# Patient Record
Sex: Female | Born: 1965 | Race: White | Hispanic: No | Marital: Single | Smoking: Current some day smoker
Health system: Southern US, Community
[De-identification: ages and names within clinical notes are randomized; demographics above are authoritative.]

## PROBLEM LIST (undated history)

## (undated) DIAGNOSIS — I10 Essential (primary) hypertension: Secondary | ICD-10-CM

---

## 2005-02-09 ENCOUNTER — Ambulatory Visit (HOSPITAL_COMMUNITY): Admission: RE | Admit: 2005-02-09 | Discharge: 2005-02-09 | Payer: Self-pay | Admitting: Neurology

## 2017-05-24 ENCOUNTER — Encounter: Payer: Self-pay | Admitting: Emergency Medicine

## 2017-05-24 ENCOUNTER — Other Ambulatory Visit: Payer: Self-pay

## 2017-05-24 ENCOUNTER — Emergency Department (HOSPITAL_BASED_OUTPATIENT_CLINIC_OR_DEPARTMENT_OTHER)
Admission: EM | Admit: 2017-05-24 | Discharge: 2017-05-24 | Disposition: A | Discharge status: Home / Self Care | Payer: Self-pay | Attending: Emergency Medicine | Admitting: Emergency Medicine

## 2017-05-24 DIAGNOSIS — K0889 Other specified disorders of teeth and supporting structures: Secondary | ICD-10-CM | POA: Insufficient documentation

## 2017-05-24 DIAGNOSIS — I10 Essential (primary) hypertension: Secondary | ICD-10-CM | POA: Insufficient documentation

## 2017-05-24 DIAGNOSIS — F17200 Nicotine dependence, unspecified, uncomplicated: Secondary | ICD-10-CM | POA: Insufficient documentation

## 2017-05-24 DIAGNOSIS — F1210 Cannabis abuse, uncomplicated: Secondary | ICD-10-CM | POA: Insufficient documentation

## 2017-05-24 HISTORY — DX: Essential (primary) hypertension: I10

## 2017-05-24 MED ORDER — BENZOCAINE 10 % MT GEL: 1.0000 "application " | OROMUCOSAL | 0 refills | Status: AC | PRN

## 2017-05-24 MED ORDER — PENICILLIN V POTASSIUM 500 MG PO TABS: 500.0000 mg | ORAL_TABLET | Freq: Four times a day (QID) | ORAL | 0 refills | Status: AC

## 2017-05-24 MED ORDER — IBUPROFEN 400 MG PO TABS
600.0000 mg | ORAL_TABLET | Freq: Once | ORAL | Status: AC
  Administered 2017-05-24: 600 mg via ORAL
  Filled 2017-05-24: qty 1

## 2017-05-24 NOTE — ED Triage Notes (Signed)
Presents with upper right molar pain and swelling to jaw that began yesterday. Pain is worse with opening mouth and talking.

## 2017-05-24 NOTE — ED Provider Notes (Signed)
[facility] EMERGENCY DEPARTMENT Provider Note   CSN: [id number] Arrival date & time: 05/24/17  1108     History   Chief Complaint Chief Complaint  Patient presents with  . Dental Pain    HPI  Bailey Mccullough is a 52 y.o. Female with a history of hypertension, presents to the ED for evaluation of dental pain.  Patient reports pain over the right upper molars, and swelling around the upper jaw that began yesterday.  Patient reports pain is made worse with opening the mouth, talking and eating.  Patient denies any drainage in the teeth or gums.  No swelling or pain under the tongue, no difficulty breathing, no neck pain or tenderness.  Denies any fevers or chills, no nausea or vomiting.  Patient tried some ibuprofen for pain which did improve the pain some.  Reports 1 previous instance of similar symptoms, reports her dentist put her on antibiotics, and then performed extraction.  Patient's dentist is Dr. Ronalee RedHartsell.      Past Medical History:  Diagnosis Date  . Hypertension     There are no active problems to display for this patient.   History reviewed. No pertinent surgical history.  OB History    No data available       Home Medications    Prior to Admission medications   Not on File    Family History History reviewed. No pertinent family history.  Social History Social History   Tobacco Use  . Smoking status: Current Some Day Smoker  Substance Use Topics  . Alcohol use: No    Frequency: Never  . Drug use: Yes    Types: Marijuana     Allergies   Patient has no known allergies.   Review of Systems Review of Systems  Constitutional: Negative for chills and fever.  HENT: Positive for dental problem, ear pain and facial swelling. Negative for trouble swallowing and voice change.   Eyes: Negative for pain, discharge, redness, itching and visual disturbance.  Respiratory: Negative for shortness of breath and stridor.   Gastrointestinal:  Negative for nausea and vomiting.     Physical Exam Updated Vital Signs BP (!) 185/98 (BP Location: Right Arm)   Pulse 100   Temp 98.2 F (36.8 C) (Oral)   Resp 16   LMP  (Approximate)   SpO2 95%   Physical Exam  Constitutional: She appears well-developed and well-nourished. No distress.  HENT:  Head: Normocephalic and atraumatic.  Teeth in general are in poor dentition, tenderness to palpation over first and second right upper molars, with erythema and swelling of the surrounding gums, there is an area of induration over the gumline, but no obvious fluctuance or fluid drainage, no sublingual swelling or tenderness, posterior oropharynx is clear with anterior and posterior arches visible, there is some facial swelling over the right cheek, swelling does not extend all the way up to the orbit, no fluctuance on palpation  Eyes: EOM are normal. Pupils are equal, round, and reactive to light. Right eye exhibits no discharge. Left eye exhibits no discharge.  No periorbital swelling, no pain with EOMs  Neck: Normal range of motion. Neck supple.  No tenderness over the neck, normal range of motion  Pulmonary/Chest: Effort normal. No respiratory distress.  Neurological: She is alert. Coordination normal.  Skin: She is not diaphoretic.  Psychiatric: She has a normal mood and affect. Her behavior is normal.  Nursing note and vitals reviewed.    ED Treatments / Results  Labs (all labs ordered are listed, but only abnormal results are displayed) Labs Reviewed - No data to display  EKG  EKG Interpretation None       Radiology No results found.  Procedures Procedures (including critical care time)  Medications Ordered in ED Medications  ibuprofen (ADVIL,MOTRIN) tablet 600 mg (600 mg Oral Given 05/24/17 1151)     Initial Impression / Assessment and Plan / ED Course  I have reviewed the triage vital signs and the nursing notes.  Pertinent labs & imaging results that were  available during my care of the patient were reviewed by me and considered in my medical decision making (see chart for details).  Patient with toothache over right upper molars, with some facial swelling.  Initially tachycardic and hypertensive, I feel this is likely due to pain as patient is very well-appearing, will reassess vitals after pain treated here in the ED.  Exam unconcerning for Ludwig's angina or spread of infection.  No periorbital swelling, EOMs intact, no vision changes.    Pain treated here in the ED with improvement, tachycardia resolved and hypertension improved, no headache, vision changes, CP or SOB to suggest hypertensive urgency or emergency.  Patient also reports she has been told by her primary care doctor that she has white coat syndrome and her blood pressure is always elevated when she is in the doctor's office.  Will have patient follow-up with her primary care doctor for blood pressure recheck as she has had hypertension requiring treatment in the past, but this improved with diet and exercise.  Will treat with penicillin and anti-inflammatories medicine.  Urged patient to follow-up with dentist, she plans to call Dr. Leda Quail office in the morning.   Final Clinical Impressions(s) / ED Diagnoses   Final diagnoses:  Pain, dental  Toothache    ED Discharge Orders    None       Legrand Rams 05/24/17 1225    Benjiman Core, MD 05/24/17 1544

## 2017-05-24 NOTE — Discharge Instructions (Addendum)
Please take entire course of antibiotics as directed even if symptoms improve.  Please call tomorrow morning to schedule follow-up appointment with your dentist.  You continue using ibuprofen and Tylenol for pain as well as topical Orajel.  If you have worsening swelling, or swelling around your eye, pain with looking side to side, difficulty breathing or shortness of breath, swelling or pain under the tongue or other new or concerning symptoms please return to the ED for reevaluation.  These follow-up with your primary care doctor in [place] for a blood pressure recheck in the next 1-2 weeks.

## 2018-11-03 ENCOUNTER — Emergency Department (HOSPITAL_BASED_OUTPATIENT_CLINIC_OR_DEPARTMENT_OTHER): Payer: Self-pay

## 2018-11-03 ENCOUNTER — Encounter (HOSPITAL_BASED_OUTPATIENT_CLINIC_OR_DEPARTMENT_OTHER): Payer: Self-pay | Admitting: Adult Health

## 2018-11-03 ENCOUNTER — Other Ambulatory Visit: Payer: Self-pay

## 2018-11-03 ENCOUNTER — Emergency Department (HOSPITAL_BASED_OUTPATIENT_CLINIC_OR_DEPARTMENT_OTHER)
Admission: EM | Admit: 2018-11-03 | Discharge: 2018-11-03 | Disposition: A | Discharge status: Home / Self Care | Payer: Self-pay | Attending: Emergency Medicine | Admitting: Emergency Medicine

## 2018-11-03 DIAGNOSIS — I10 Essential (primary) hypertension: Secondary | ICD-10-CM | POA: Insufficient documentation

## 2018-11-03 DIAGNOSIS — Z720 Tobacco use: Secondary | ICD-10-CM | POA: Insufficient documentation

## 2018-11-03 DIAGNOSIS — R2242 Localized swelling, mass and lump, left lower limb: Secondary | ICD-10-CM | POA: Insufficient documentation

## 2018-11-03 DIAGNOSIS — M79672 Pain in left foot: Secondary | ICD-10-CM | POA: Insufficient documentation

## 2018-11-03 DIAGNOSIS — Z79899 Other long term (current) drug therapy: Secondary | ICD-10-CM | POA: Insufficient documentation

## 2018-11-03 MED ORDER — CLINDAMYCIN HCL 300 MG PO CAPS: 300.0000 mg | ORAL_CAPSULE | Freq: Three times a day (TID) | ORAL | 0 refills | Status: AC

## 2018-11-03 NOTE — ED Triage Notes (Signed)
Bailey Mccullough presents with 24 hours of left foot pain at the midfoot, she denies injury, states the pain is severe. Area slightly ertythematous. Good pedal pulses. Took one percocet with some relief.

## 2018-11-03 NOTE — Discharge Instructions (Addendum)
Start clindamycin in case this is infectious.  Recommend ice, Tylenol, Motrin at home for pain and swelling as well in case this is inflammatory.  Please return to the ED if symptoms worsen especially after 48 to 72 hours.  Possibly you have a sprain from an unknown injury as well.

## 2018-11-03 NOTE — ED Provider Notes (Signed)
[facility] EMERGENCY DEPARTMENT Provider Note   CSN: [id number] Arrival date & time: 11/03/18  1727    History   Chief Complaint Chief Complaint  Patient presents with  . Foot Pain    HPI Bailey Mccullough is a 53 y.o. female.     Possible bug bite to left foot.  The history is provided by the patient.  Foot Pain This is a new problem. The current episode started yesterday. The problem occurs constantly. The problem has not changed since onset.Pertinent negatives include no chest pain, no abdominal pain and no shortness of breath. The symptoms are aggravated by walking. Nothing relieves the symptoms. She has tried nothing for the symptoms. The treatment provided no relief.    Past Medical History:  Diagnosis Date  . Hypertension     There are no active problems to display for this patient.   History reviewed. No pertinent surgical history.   OB History   No obstetric history on file.      Home Medications    Prior to Admission medications   Medication Sig Start Date End Date Taking? Authorizing Provider  benzocaine (ORAJEL) 10 % mucosal gel Use as directed 1 application in the mouth or throat as needed for mouth pain. 05/24/17   Dartha LodgeFord, Kelsey N, PA-C  clindamycin (CLEOCIN) 300 MG capsule Take 1 capsule (300 mg total) by mouth 3 (three) times daily for 10 days. 11/03/18 11/13/18  Virgina Norfolkuratolo, Doyle Kunath, DO    Family History History reviewed. No pertinent family history.  Social History Social History   Tobacco Use  . Smoking status: Current Some Day Smoker  Substance Use Topics  . Alcohol use: No    Frequency: Never  . Drug use: Yes    Types: Marijuana     Allergies   Patient has no known allergies.   Review of Systems Review of Systems  Constitutional: Negative for chills and fever.  HENT: Negative for ear pain and sore throat.   Eyes: Negative for pain and visual disturbance.  Respiratory: Negative for cough and shortness of breath.    Cardiovascular: Negative for chest pain and palpitations.  Gastrointestinal: Negative for abdominal pain and vomiting.  Genitourinary: Negative for dysuria and hematuria.  Musculoskeletal: Positive for gait problem (left foot pain). Negative for arthralgias and back pain.  Skin: Negative for color change and rash.  Neurological: Negative for seizures, syncope, weakness and numbness.  All other systems reviewed and are negative.    Physical Exam Updated Vital Signs  ED Triage Vitals  Enc Vitals Group     BP 11/03/18 1736 (!) 159/95     Pulse Rate 11/03/18 1736 94     Resp 11/03/18 1736 18     Temp 11/03/18 1736 99.5 F (37.5 C)     Temp Source 11/03/18 1736 Oral     SpO2 11/03/18 1736 99 %     Weight --      Height --      Head Circumference --      Peak Flow --      Pain Score 11/03/18 1737 9     Pain Loc --      Pain Edu? --      Excl. in GC? --     Physical Exam Vitals signs and nursing note reviewed.  Constitutional:      General: She is not in acute distress.    Appearance: She is well-developed.  HENT:     Head: Normocephalic and atraumatic.  Eyes:     Conjunctiva/sclera: Conjunctivae normal.  Neck:     Musculoskeletal: Neck supple.  Cardiovascular:     Rate and Rhythm: Normal rate and regular rhythm.     Pulses: Normal pulses.     Heart sounds: No murmur.  Pulmonary:     Effort: Pulmonary effort is normal. No respiratory distress.     Breath sounds: Normal breath sounds.  Abdominal:     Palpations: Abdomen is soft.     Tenderness: There is no abdominal tenderness.  Musculoskeletal: Normal range of motion.        General: Swelling (trace of left foot) and tenderness (TTP to midfoot of top of left foot) present.  Skin:    General: Skin is warm and dry.     Findings: Erythema (on top of midfoot of left foot) present.  Neurological:     Mental Status: She is alert.     Sensory: No sensory deficit.     Motor: No weakness.      ED Treatments /  Results  Labs (all labs ordered are listed, but only abnormal results are displayed) Labs Reviewed - No data to display  EKG None  Radiology Dg Ankle Complete Left  Result Date: 11/03/2018 CLINICAL DATA:  Pain EXAM: LEFT ANKLE COMPLETE - 3+ VIEW COMPARISON:  None. FINDINGS: There is no acute displaced fracture or dislocation. There is a small plantar calcaneal spur. There are calcifications in the plantar fascia. There is minimal soft tissue swelling surrounding the lateral malleolus. IMPRESSION: No acute displaced fracture or dislocation. Electronically Signed   By: Katherine Mantlehristopher  Green M.D.   On: 11/03/2018 18:43   Dg Foot Complete Left  Result Date: 11/03/2018 CLINICAL DATA:  24 hours of LT foot pain at the midfoot, she denies injury, states the pain is severe. Area slightly erythematous. EXAM: LEFT FOOT - COMPLETE 3+ VIEW COMPARISON:  None. FINDINGS: No fracture or bone lesion. Joints are normally spaced and aligned.  No arthropathic changes. Small to moderate-sized plantar and dorsal calcaneal spurs. There are some calcifications along the plantar fascia near the calcaneus. Soft tissues are otherwise unremarkable. IMPRESSION: 1. No fracture, bone lesion or joint abnormality. 2. Calcaneal spurs. Electronically Signed   By: Amie Portlandavid  Ormond M.D.   On: 11/03/2018 18:42    Procedures Procedures (including critical care time)  Medications Ordered in ED Medications - No data to display   Initial Impression / Assessment and Plan / ED Course  I have reviewed the triage vital signs and the nursing notes.  Pertinent labs & imaging results that were available during my care of the patient were reviewed by me and considered in my medical decision making (see chart for details).     Bailey Mccullough is a 53 year old female with no significant medical history who presents to the ED with left foot swelling, pain.  Unknown if there was any injury.  She states that she felt like maybe she was bit by  something.  Has some pain and swelling over the top of the foot.  Mild area of redness.  X-ray showed no fractures.  Did show some soft tissue swelling.  Possible sprain versus infectious process versus inflammatory process.  No concern for septic joint.  Patient has normal range of motion, no fever.  Will recommend Tylenol, Motrin.  Will treat with clindamycin in case there is an element of cellulitis.  Patient does not have history of diabetes.  Recommend recheck in 48 to 72 hours if symptoms do  not improve with these treatments.  Patient discharged from ED in good condition.  This chart was dictated using voice recognition software.  Despite best efforts to proofread,  errors can occur which can change the documentation meaning.    Final Clinical Impressions(s) / ED Diagnoses   Final diagnoses:  Foot pain, left    ED Discharge Orders         Ordered    clindamycin (CLEOCIN) 300 MG capsule  3 times daily     11/03/18 1852           Lennice Sites, DO 11/03/18 2841

## 2020-05-20 IMAGING — DX LEFT FOOT - COMPLETE 3+ VIEW
3 series · 3 of 3 positions shown · non-contrast
Comparison: None.

CLINICAL DATA: 24 hours of LT foot pain at the midfoot, she denies
injury, states the pain is severe. Area slightly erythematous.

EXAM:
LEFT FOOT - COMPLETE 3+ VIEW

[foot ap]
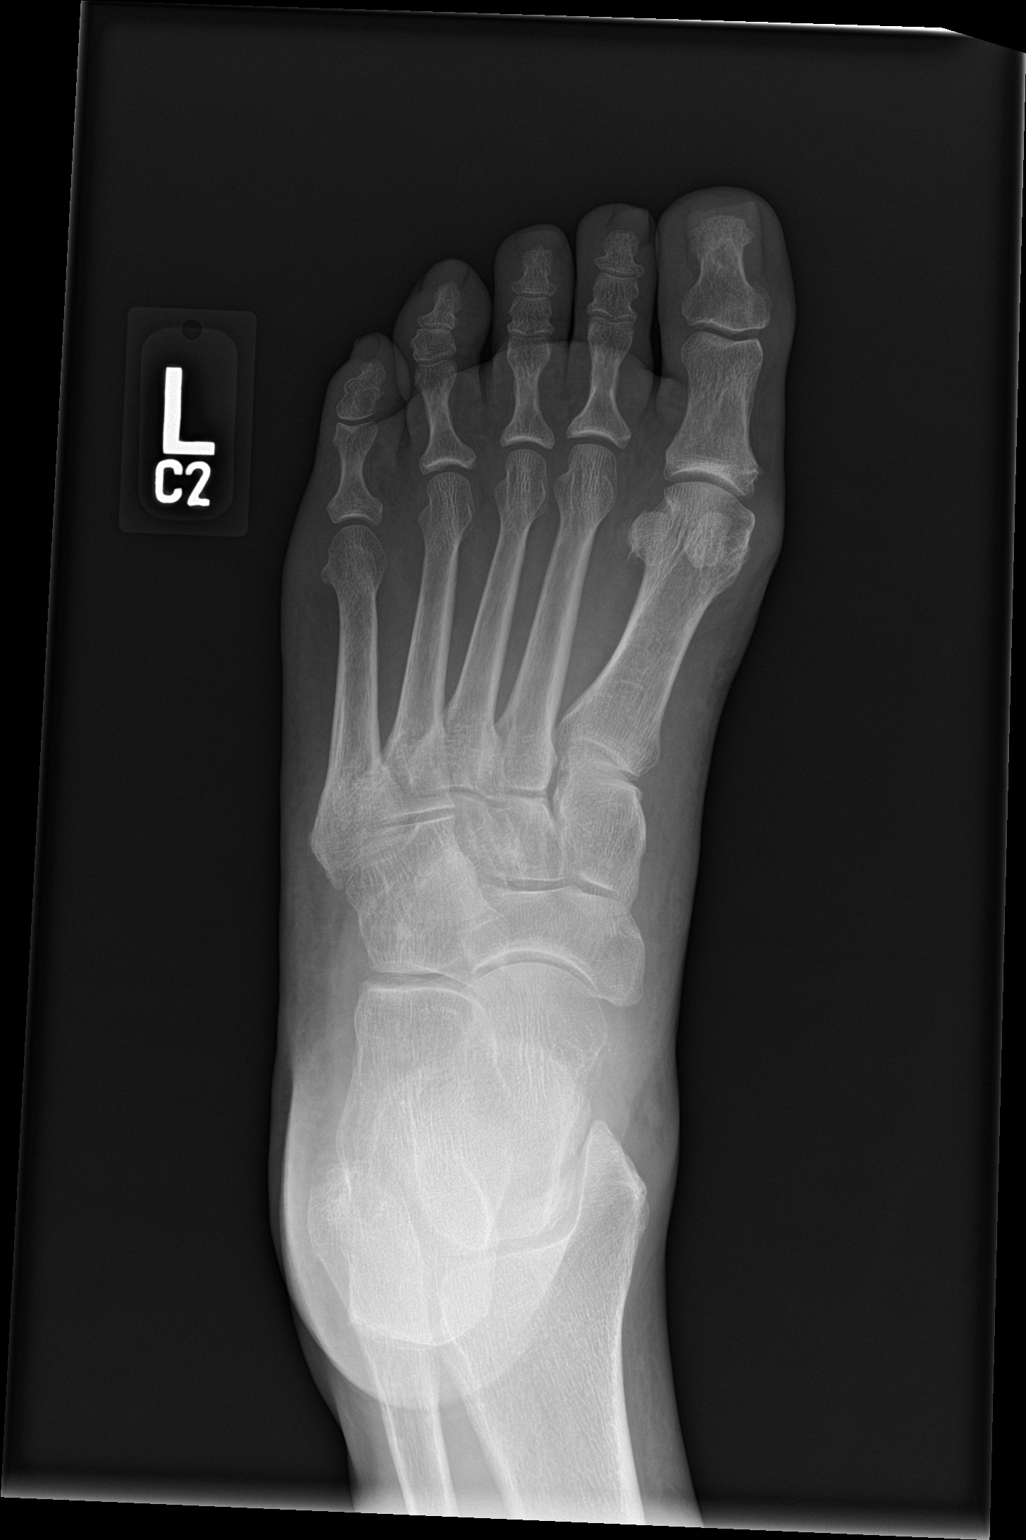

[foot obl]
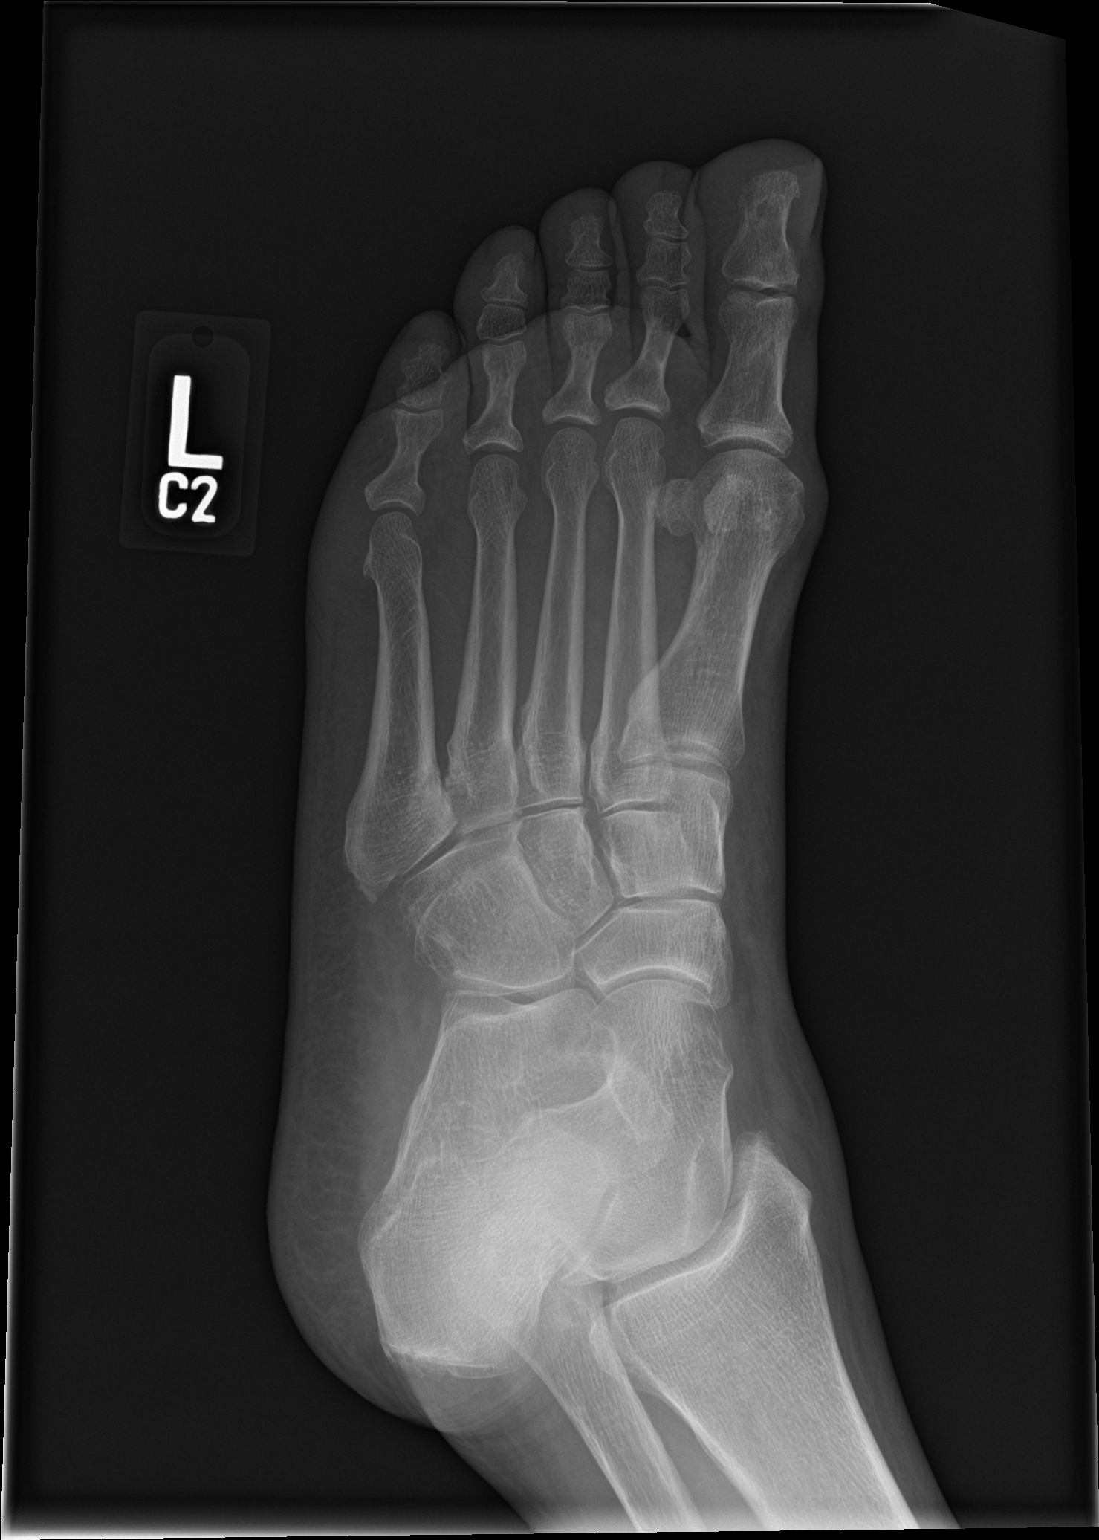

[foot lat]
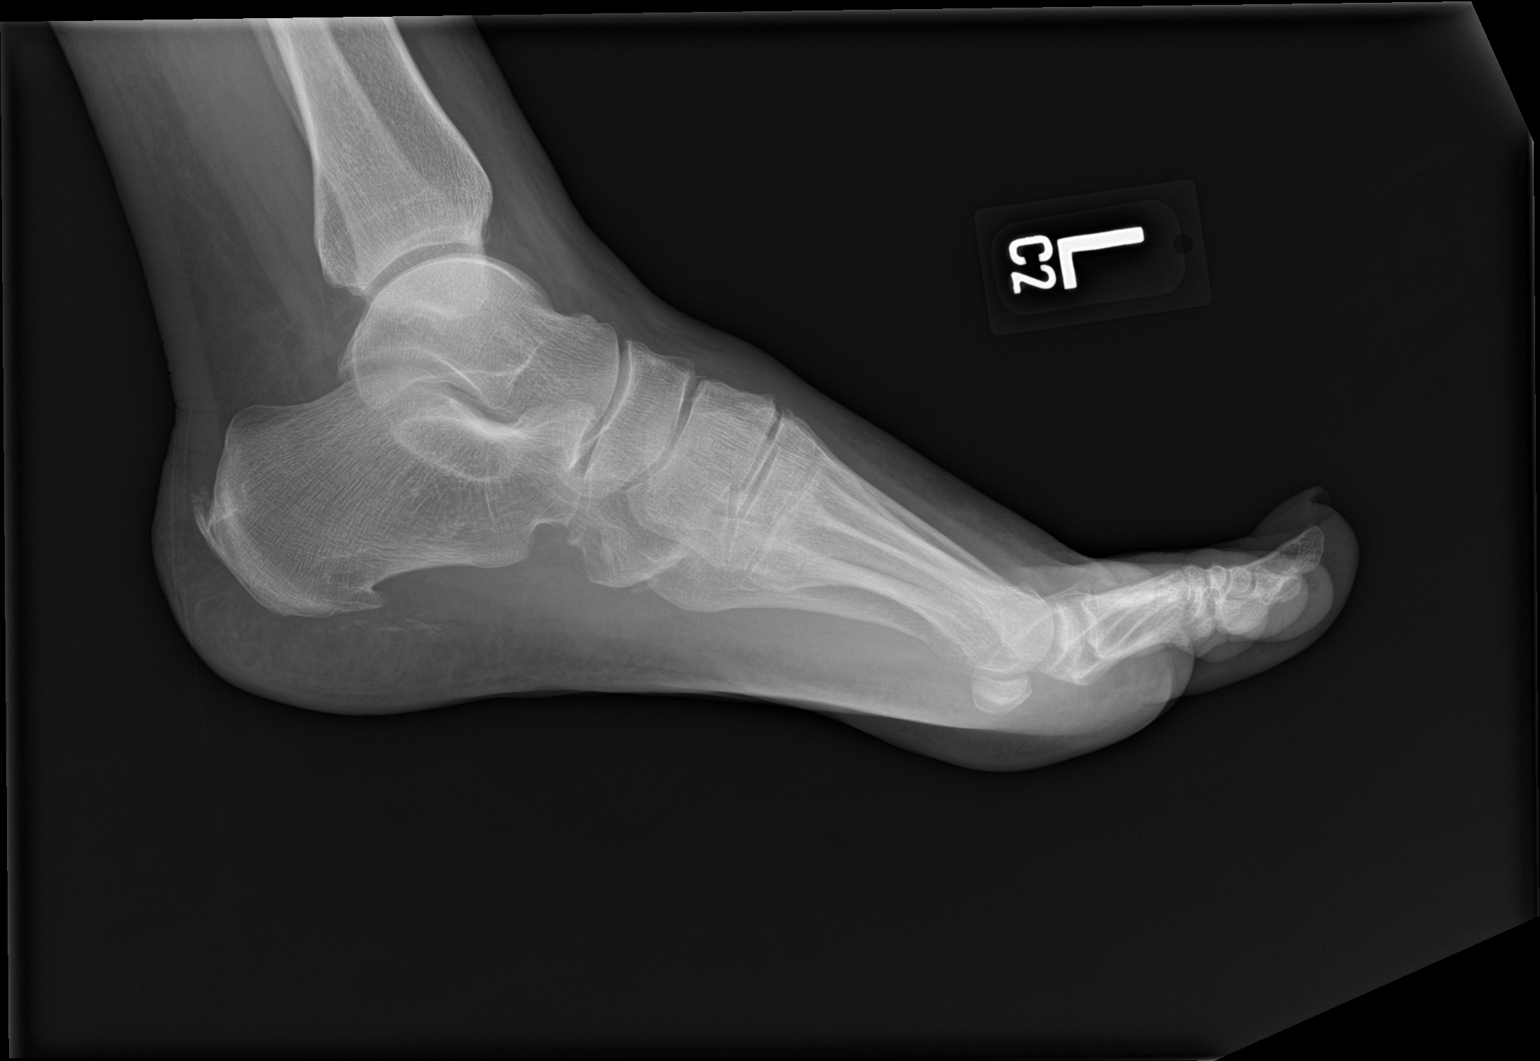

[3 of 3 positions shown; findings below may reference images not displayed]

FINDINGS: No fracture or bone lesion.

Joints are normally spaced and aligned.  No arthropathic changes.

Small to moderate-sized plantar and dorsal calcaneal spurs.

There are some calcifications along the plantar fascia near the
calcaneus. Soft tissues are otherwise unremarkable.
IMPRESSION: 1. No fracture, bone lesion or joint abnormality.
2. Calcaneal spurs.
